# Patient Record
Sex: Female | Born: 1995 | Race: White | Hispanic: Yes | Marital: Married
Health system: Southern US, Community
[De-identification: ages and names within clinical notes are randomized; demographics above are authoritative.]

---
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
## 2013-03-29 ENCOUNTER — Emergency Department: Payer: Self-pay | Admitting: Emergency Medicine

## 2013-03-29 LAB — URINALYSIS, COMPLETE
Bacteria: NONE SEEN
Ketone: NEGATIVE
Ph: 7 (ref 4.5–8.0)
RBC,UR: 2 /HPF (ref 0–5)
Specific Gravity: 1.015 (ref 1.003–1.030)
Squamous Epithelial: 6
WBC UR: <1 /HPF (ref 0–5)

## 2015-04-16 NOTE — H&P (Signed)
L&D Evaluation:  History:   HPI 19 yo G2P1001 at term presented 1 day ago with contractions and onset of labor.  She had been having ctxs for 2 days and repots they have gotten stronger and more frequent.  PNC at [facility].  Preg uncomplicated.  Cvx 8cm with membranes intact on arrival.    Presents with contractions    Patient's Medical History No Chronic Illness    Patient's Surgical History none    Medications Pre Natal Vitamins    Allergies NKDA    Social History none    Family History Non-Contributory   ROS:   ROS All systems were reviewed.  HEENT, CNS, GI, GU, Respiratory, CV, Renal and Musculoskeletal systems were found to be normal., Painful contractions   Exam:   Vital Signs BP >140/90    General Painful ctxs    Mental Status clear    Chest clear    Heart normal sinus rhythm    Abdomen gravid, tender with contractions    Fetal Position Vertex    Edema no edema    Pelvic Complete +2    Mebranes Intact    FHT normal rate with no decels    Ucx regular    Skin dry   Impression:   Impression active labor   Plan:   Plan monitor contractions and for cervical change    Comments Pt quickly progressed to complete and delivery shortly after AROM.   Electronic Signatures: Senaida LangeWeaver-Lee, Mariavictoria Nottingham (MD)  (Signed [phone] [id number]-Feb-13 15:04)  Authored: L&D Evaluation   Last Updated: 28-Feb-13 15:04 by Senaida LangeWeaver-Lee, Aristotelis Vilardi (MD)

## 2018-12-09 HISTORY — PX: WISDOM TOOTH EXTRACTION: SHX21

## 2018-12-12 LAB — HM HIV SCREENING LAB: HM HIV Screening: NEGATIVE

## 2019-12-27 ENCOUNTER — Ambulatory Visit: Payer: Self-pay

## 2021-06-05 ENCOUNTER — Ambulatory Visit: Payer: Self-pay

## 2021-06-05 ENCOUNTER — Telehealth: Payer: Self-pay

## 2021-06-05 ENCOUNTER — Other Ambulatory Visit: Payer: Self-pay

## 2021-06-05 NOTE — Progress Notes (Signed)
Patient not in waiting room area after calling her. Checked 3 different occasions and even called patient to see if she was nearby. No answer and unsure if patient left or plans to return.   Floy Sabina, RN

## 2021-06-05 NOTE — Telephone Encounter (Signed)
Attempted to contact patient because she is not in the waiting room, called her to see if she was here. No answer and left voicemail.   Floy Sabina, RN
